# Patient Record
Sex: Female | Born: 1970 | Race: White | Hispanic: No | Marital: Married | Smoking: Never smoker
Health system: Southern US, Community
[De-identification: ages and names within clinical notes are randomized; demographics above are authoritative.]

---

## 1998-03-11 ENCOUNTER — Other Ambulatory Visit: Admission: RE | Admit: 1998-03-11 | Discharge: 1998-03-11 | Payer: Self-pay | Admitting: Obstetrics and Gynecology

## 1999-03-15 ENCOUNTER — Other Ambulatory Visit: Admission: RE | Admit: 1999-03-15 | Discharge: 1999-03-15 | Payer: Self-pay | Admitting: Obstetrics and Gynecology

## 2000-04-04 ENCOUNTER — Other Ambulatory Visit: Admission: RE | Admit: 2000-04-04 | Discharge: 2000-04-04 | Payer: Self-pay | Admitting: Obstetrics and Gynecology

## 2001-04-05 ENCOUNTER — Other Ambulatory Visit: Admission: RE | Admit: 2001-04-05 | Discharge: 2001-04-05 | Payer: Self-pay | Admitting: Obstetrics and Gynecology

## 2002-04-15 ENCOUNTER — Other Ambulatory Visit: Admission: RE | Admit: 2002-04-15 | Discharge: 2002-04-15 | Payer: Self-pay | Admitting: Obstetrics and Gynecology

## 2003-04-21 ENCOUNTER — Other Ambulatory Visit: Admission: RE | Admit: 2003-04-21 | Discharge: 2003-04-21 | Payer: Self-pay | Admitting: Obstetrics and Gynecology

## 2004-05-13 ENCOUNTER — Other Ambulatory Visit: Admission: RE | Admit: 2004-05-13 | Discharge: 2004-05-13 | Payer: Self-pay | Admitting: Obstetrics and Gynecology

## 2005-06-10 ENCOUNTER — Other Ambulatory Visit: Admission: RE | Admit: 2005-06-10 | Discharge: 2005-06-10 | Payer: Self-pay | Admitting: Obstetrics and Gynecology

## 2005-10-06 ENCOUNTER — Encounter (INDEPENDENT_AMBULATORY_CARE_PROVIDER_SITE_OTHER): Payer: Self-pay | Admitting: Specialist

## 2005-10-06 ENCOUNTER — Ambulatory Visit (HOSPITAL_COMMUNITY): Admission: RE | Admit: 2005-10-06 | Discharge: 2005-10-06 | Payer: Self-pay | Admitting: Obstetrics and Gynecology

## 2008-02-25 ENCOUNTER — Encounter (INDEPENDENT_AMBULATORY_CARE_PROVIDER_SITE_OTHER): Payer: Self-pay | Admitting: Obstetrics and Gynecology

## 2008-02-25 ENCOUNTER — Ambulatory Visit (HOSPITAL_COMMUNITY): Admission: RE | Admit: 2008-02-25 | Discharge: 2008-02-26 | Payer: Self-pay | Admitting: Obstetrics and Gynecology

## 2008-05-30 HISTORY — PX: PARTIAL HYSTERECTOMY: SHX80

## 2010-10-12 NOTE — Op Note (Signed)
Kathleen Mercer, Kathleen Mercer                   ACCOUNT NO.:  [id number]   MEDICAL RECORD NO.:  [id number]          PATIENT TYPE:  OIB   LOCATION:  9305                          FACILITY:  [facility]   PHYSICIAN:  Dineen Kid. Rana Snare, M.D.    DATE OF BIRTH:  25-Jul-1970   DATE OF PROCEDURE:  DATE OF DISCHARGE:                               OPERATIVE REPORT   PREOPERATIVE DIAGNOSES:  Menometrorrhagia, pelvic pain, and fibroids.   POSTOPERATIVE DIAGNOSES:  Menometrorrhagia, pelvic pain, and fibroids.   PROCEDURE:  Laparoscopic-assisted vaginal hysterectomy.   SURGEON:  Dineen Kid. Rana Snare, MD   ASSISTANT:  Duke Salvia. Marcelle Overlie, MD   ANESTHESIA:  General endotracheal.   INDICATIONS:  [name] is a 40 year old with worsening problems of  menometrorrhagia and pelvic pain.  She underwent endometrial ablation  which was successful for a short period of time, then she began having  irregular bleeding and pain not only with intercourse but also without.  She desires definitive surgical intervention and plans hysterectomy.   DESCRIPTION OF PROCEDURE:  The patient was placed in the dorsal  lithotomy position.  She was sterilely prepped and draped.  Bladder was  sterilely drained.  Graves speculum was placed.  A Hulka tenaculum was  placed in the cervix.  A 1-cm infraumbilical skin incision was then  made.  A Veress needle was inserted.  The abdomen was insufflated with  dullness to percussion.  A 5-mm trocar was inserted in the left of  midline 2 fingerbreadths above the pubic symphysis.  Normal-appearing  liver, appendix, and ovaries were noted, and a large fibroid uterus was  also noted.  The left round ligament was identified, ligated, and  dissected using Gyrus cutting forceps.  The left utero-ovarian ligament  was ligated and dissected as was the remaining portion of the broad  ligament along the left.  The right round ligament was identified,  ligated, and dissected as was the right utero-ovarian ligament and the  remaining portion of the broad ligament along the right.  Unable to  elevate the bladder flap due to a large fibroid noted in the anterior  surface, the abdomen was then desufflated, legs repositioned, and  weighted speculum was placed in the vagina.  Posterior colpotomy was  performed.  The cervix was circumscribed with Bovie cautery.  LigaSure  instruments were used to ligate across the uterosacral ligaments  bilaterally.  Cardinal ligaments and bladder pillars were dissected with  Mayo scissors.  The bladder was dissected off the anterior surface of  the cervix.  The anterior peritoneum was entered sharply, and a Deaver  retractor was placed underneath the bladder.  The inferior portion of  the broad ligament was then ligated with LigaSure and dissected with  Mayo scissors.  The uterus was removed.  The uterosacral ligaments were  grasped and ligated with figure-of-eight 0 Monocryl suture.  Posterior  peritoneum was closed in a purse-string fashion, and the vagina was  closed in a vertical fashion using figure-of-eight 0 Monocryl suture  with good approximation.  Good hemostasis noted.  Foley catheter was  placed with  return of clear yellow urine.  The legs were repositioned.  Abdomen reinsufflated.  Nezhat suction irrigator was used to irrigate  the abdomen and pelvis.  Re-examination of the pedicles revealed good  hemostasis.  Small peritoneal bleeders were grasped with bipolar cautery  and cauterized, and after copious amount of irrigation, adequate  hemostasis was assured.  The abdomen was then desufflated.  Trocar was  removed.  The infraumbilical skin incision was closed with 0 Vicryl  interrupted suture in the fascia, 3-0 Vicryl Rapide subcuticular suture,  5-mm site was closed with 3-0  Vicryl Rapide subcuticular suture and also Dermabond.  The incisions  were injected with 0.25% Marcaine, total of 10 mL used.  The patient was  stable on transfer to recovery.  Sponge and  instrument counts were  normal x3.  Estimated blood loss 200 mL.  The patient received 1 g of  cefotetan preoperatively.      Dineen Kid Rana Snare, M.D.  Electronically Signed     DCL/MEDQ  D:  02/25/2008  T:  02/25/2008  Job:  [id number]

## 2010-10-12 NOTE — Discharge Summary (Signed)
Kathleen Mercer, Kathleen Mercer                   ACCOUNT NO.:  [id number]   MEDICAL RECORD NO.:  [id number]          PATIENT TYPE:  OIB   LOCATION:  9305                          FACILITY:  [facility]   PHYSICIAN:  Dineen Kid. Rana Snare, M.D.    DATE OF BIRTH:  10-16-1970   DATE OF ADMISSION:  02/25/2008  DATE OF DISCHARGE:  02/26/2008                               DISCHARGE SUMMARY   HISTORY OF PRESENT ILLNESS:  Ms. Tindol is a 40 year old G3, P1 with  problems with menometrorrhagia, pelvic pain.  She has undergone  endometrial ablation which is successful only for a short period of  time.  She resumed to have irregular periods, pain all the time.  She  has fibroids which have shown degeneration.  She desired definitive  surgical intervention and presents for hysterectomy.   HOSPITAL COURSE:  The patient underwent a laparoscopic-assisted vaginal  hysterectomy and surgery was uncomplicated.  Estimated blood loss was  200 mL.  Her postoperative care was unremarkable.  She had good return  of bowel function, was ambulating.  On postoperative day #1, was  ambulating without difficulty, tolerating regular diet and able to pass  flatus.  Abdomen is soft, nontender, and nondistended with normoactive  bowel sounds, and the patient was discharged home.  Her postoperative  hemoglobin was 12.2.   DISPOSITION:  The patient will be discharged home.  She will follow up  in the office in 2-3 weeks.  Sent home with routine instruction sheet  for hysterectomy, told to return for increased pain, fever, or bleeding.  She is also sent a prescription for Tylox #20.      Dineen Kid Rana Snare, M.D.  Electronically Signed     DCL/MEDQ  D:  02/26/2008  T:  02/27/2008  Job:  [id number]

## 2010-10-12 NOTE — H&P (Signed)
Kathleen Mercer, Kathleen Mercer                   ACCOUNT NO.:  [id number]   MEDICAL RECORD NO.:  [id number]          PATIENT TYPE:  AMB   LOCATION:                                FACILITY:  [facility]   PHYSICIAN:  Dineen Kid. Rana Snare, M.D.    DATE OF BIRTH:  [birth year]   DATE OF ADMISSION:  02/25/2008  DATE OF DISCHARGE:                              HISTORY & PHYSICAL   HISTORY OF PRESENT ILLNESS:  Ms. Firebaugh is a 40 year old G3, P1, with  worsening problems with menometrorrhagia and pelvic pain.  She has  undergone an endometrial ablation which was successful for a short  period of time, but she began having irregular bleeding and pain, not  only with intercourse but also without.  She has had a tubal ligation in  the past as well.  She desires definitive surgical intervention and  presents for laparoscopic-assisted vaginal hysterectomy.   PAST MEDICAL HISTORY:  Significant for a history of anxiety, depression,  currently on Celexa.   CURRENT MEDICATIONS:  Include Celexa 20 mg daily.   ALLERGIES:  She has no known drug allergies.   PAST SURGERIES:  Tubal ligation, also endometrial ablation back in 2007.   PHYSICAL EXAMINATION:  VITAL SIGNS:  Blood pressure is 112/80.  HEART:  Regular rate and rhythm.  LUNGS:  Clear to auscultation bilaterally.  ABDOMEN:  Nondistended, nontender.  BACK:  Without CVA tenderness.  PELVIC EXAM:  The uterus is anteverted, mobile, nontender to deep  palpation.  Good bladder and rectal support.   Most recent ultrasound shows a uterus measuring 9.4 x 6.1 x 6.9 cm.  She  also has several intramural fibroids, the largest measuring 4 cm, which  are intramural in location, normal-appearing ovaries.   IMPRESSION AND:  Menometrorrhagia, pelvic pain and fibroids.  She  desires definitive intervention and requests hysterectomy.  She is  status post ablation and tubal ligation.   PLAN:  Laparoscopic-assisted vaginal hysterectomy.  The risks and  benefits of procedure were discussed  at length which include but are not  limited to risk of infection, bleeding, damage to the bowel, bladder,  ureters, ovaries, risks associated with anesthesia, risks associated  with blood transfusion.  She gives informed consent and wishes to  proceed.      Dineen Kid Rana Snare, M.D.  Electronically Signed    DCL/MEDQ  D:  02/24/2008  T:  02/24/2008  Job:  [id number]

## 2010-10-15 NOTE — Op Note (Signed)
Kathleen Mercer, Kathleen Mercer                   ACCOUNT NO.:  [id number]   MEDICAL RECORD NO.:  [id number]          PATIENT TYPE:  AMB   LOCATION:  SDC                           FACILITY:  [facility]   PHYSICIAN:  Dineen Kid. Rana Snare, M.D.    DATE OF BIRTH:  1971/03/31   DATE OF PROCEDURE:  10/06/2005  DATE OF DISCHARGE:                                 OPERATIVE REPORT   PREOPERATIVE DIAGNOSES:  1.  Multiparity with desired sterility.  2.  Menorrhagia and abnormal uterine bleeding.  3.  Fibroids.   POSTOPERATIVE DIAGNOSES:  1.  Multiparity with desired sterility.  2.  Menorrhagia and abnormal uterine bleeding.  3.  Fibroids.  4.  Pelvic adhesions and endometrial polyp.   PROCEDURE:  1.  Laparoscopic bilateral tubal ligation.  2.  Lysis of adhesions.  3.  Removal of intrauterine device.  4.  Hysteroscopy.  5.  Dilatation and curettage with polypectomy.  6.  NovaSure endometrial ablation.   SURGEON:  Dineen Kid. Rana Snare, M.D.   ANESTHESIA:  General endotracheal.   INDICATIONS:  Ms. Cina is a 40 year old G3, P2, A1, with menorrhagia,  desired sterility, who continued to have abnormal bleeding despite Mirena  IUD.  She desires sterility and presents for removal of IUD with tubal  ligation and also NovaSure endometrial ablation for her menorrhagia.  The  risks and benefits of the procedure were discussed at length; these include,  but not limited to, risks of infection, bleeding, damage to the uterus,  tubes, bowel and bladder and the possibility this may not alleviate the  bleeding and may actually worsen, risk of tubal failure quoted at 5 our of a  thousands, also risks associated with surgery, which could include blood  loss, risks of blood transfusion, general anesthesia or damage to internal  organs.  She give her informed consent and wishes to proceed.   FINDINGS AT TIME OF SURGERY:  Normal appendix and liver.  The uterus does  have several fibroids, some intramural and some subserosal, approximately  2-  3 cm in size.  The left ovary is adhered to the pelvic sidewall and also the  descending colon, otherwise normal-appearing abdomen.  Hysteroscopy revealed  a moderate-sized endometrial polyp, otherwise normal endometrial cavity  after D&C.   DESCRIPTION OF PROCEDURE:  After adequate analgesia, the patient was placed  in the dorsal lithotomy position where she was sterile prepped and draped  and the bladder sterilely drained.  A Graves speculum was placed.  Polyp  forceps were used to grasp and remove the IUD without any complications.  A  Hulka tenaculum was then placed on the cervix.  A 1-cm infraumbilical skin  incision was made.  A Veress needle was inserted.  The abdomen was  insufflated to dullness to percussion.  An 11-mm trocar was inserted and the  laparoscope was inserted; the above findings were noted.  The right and left  fallopian tubes were  identified by the fimbriated end.  Midportions of the  tubes were grasped with the bipolar cautery and cauterized over  approximately a 2- to 3-cm  section of the tube with good thermal burn noted  throughout the entirety of the tubes and also loss of resistance on the  ohmmeter.  Re-examination of both fallopian tubes revealed good thermal burn  and no obvious complications.  The left ovary, because it was adhered to the  colon and the left pelvic sidewall, was sharply dissected from the colon and  pelvic sidewall using EndoShears and bipolar cautery with good hemostasis  achieved, care taken to avoid the underlying bowel and ureter.  The abdomen  was then desufflated and trocar removed.  The infraumbilical skin incision  was closed with a 0 Vicryl interrupted sutured in the fascia and a 3-0  Vicryl Rapide subcuticular suture.  The incision was injected with 0.25%  Marcaine, 10 mL total used.  The legs were repositioned and Hulka tenaculum  removed.  A single-tooth tenaculum was placed on the anterior lip of the  cervix.  The uterus  was sounded to 9.5 cm.  Cervix was sounded to 4 cm; it  was easily dilated to a #25 Pratt dilator.  Hysteroscope was inserted,  revealing a moderate-sized endometrial polyp.  Polyp forceps were inserted  and polyp was removed.  Curettage was performed until a gritty surface was  felt throughout the endometrial cavity.  Re-examination of the hysteroscope  at this time revealed a normal-appearing endometrial cavity, a normal-  appearing ostia and cervix.  The NovaSure instrument was then inserted.  The  cavity width was noted to be 3.5 cm.  Cavity test was performed; after this  was complete, a NovaSure was carried out for 1 minute and 9 seconds at a  power of 106.  The NovaSure was then removed.  Examination with the  hysteroscope revealed good thermal burn throughout the entirety of the  cavity, no obvious complications.  Hysteroscope was then removed, tenaculum  removed from the cervix and noted to be hemostatic.  The patient was then  transferred to the recovery room in stable condition.  Sponge and instrument  count was normal x3.  Estimated blood loss was minimal.  Sorbitol deficit  was 0.  The patient received 30 mg of Toradol postoperatively and 1 g of  Rocephin preoperatively.   DISPOSITION:  The patient will be discharged home with followup in the  office in 2-3 weeks, sent home with a routine instruction sheet for D&C and  laparoscopy, and told to return for any increased pain, fever or bleeding.      Dineen Kid Rana Snare, M.D.  Electronically Signed     DCL/MEDQ  D:  10/06/2005  T:  10/07/2005  Job:  [id number]

## 2011-02-28 LAB — DIFFERENTIAL
Basophils Absolute: 0.1
Basophils Relative: 1
Eosinophils Relative: 1
Monocytes Absolute: 0.4

## 2011-02-28 LAB — CBC
HCT: 43.7
Hemoglobin: 14.5
MCHC: 33.2
Platelets: 313
Platelets: 368
RDW: 13.3
RDW: 13.4
WBC: 14 — ABNORMAL HIGH

## 2014-11-27 ENCOUNTER — Emergency Department (HOSPITAL_COMMUNITY): Payer: 59

## 2014-11-27 ENCOUNTER — Encounter (HOSPITAL_COMMUNITY): Payer: Self-pay | Admitting: Emergency Medicine

## 2014-11-27 ENCOUNTER — Emergency Department (HOSPITAL_COMMUNITY)
Admission: EM | Admit: 2014-11-27 | Discharge: 2014-11-27 | Disposition: A | Discharge status: Home / Self Care | Payer: 59 | Attending: Emergency Medicine | Admitting: Emergency Medicine

## 2014-11-27 DIAGNOSIS — S81812A Laceration without foreign body, left lower leg, initial encounter: Secondary | ICD-10-CM | POA: Insufficient documentation

## 2014-11-27 DIAGNOSIS — Y998 Other external cause status: Secondary | ICD-10-CM | POA: Diagnosis not present

## 2014-11-27 DIAGNOSIS — W01198A Fall on same level from slipping, tripping and stumbling with subsequent striking against other object, initial encounter: Secondary | ICD-10-CM | POA: Diagnosis not present

## 2014-11-27 DIAGNOSIS — Y9289 Other specified places as the place of occurrence of the external cause: Secondary | ICD-10-CM | POA: Diagnosis not present

## 2014-11-27 DIAGNOSIS — Y9389 Activity, other specified: Secondary | ICD-10-CM | POA: Diagnosis not present

## 2014-11-27 DIAGNOSIS — Z23 Encounter for immunization: Secondary | ICD-10-CM | POA: Insufficient documentation

## 2014-11-27 MED ORDER — IBUPROFEN 800 MG PO TABS: 800.0000 mg | ORAL_TABLET | Freq: Three times a day (TID) | ORAL | Status: AC

## 2014-11-27 MED ORDER — ONDANSETRON 8 MG PO TBDP
8.0000 mg | ORAL_TABLET | Freq: Once | ORAL | Status: AC
  Administered 2014-11-27: 8 mg via ORAL
  Filled 2014-11-27: qty 1

## 2014-11-27 MED ORDER — TETANUS-DIPHTH-ACELL PERTUSSIS 5-2.5-18.5 LF-MCG/0.5 IM SUSP
0.5000 mL | Freq: Once | INTRAMUSCULAR | Status: AC
  Administered 2014-11-27: 0.5 mL via INTRAMUSCULAR
  Filled 2014-11-27: qty 0.5

## 2014-11-27 MED ORDER — CEPHALEXIN 500 MG PO CAPS: 500.0000 mg | ORAL_CAPSULE | Freq: Four times a day (QID) | ORAL | Status: AC

## 2014-11-27 MED ORDER — LIDOCAINE-EPINEPHRINE (PF) 2 %-1:200000 IJ SOLN
20.0000 mL | Freq: Once | INTRAMUSCULAR | Status: AC
  Administered 2014-11-27: 20 mL
  Filled 2014-11-27: qty 20

## 2014-11-27 NOTE — ED Notes (Addendum)
Pt was outside playing with a water hose on concrete. Had a wine glass in her hand and fell on the concrete creating a laceration on the left lower leg. Unsure if she cut her leg with the concrete or the wine glass. Unsure of last tetanus. No other c/c. Placed betadine on wound.

## 2014-11-27 NOTE — ED Provider Notes (Signed)
CSN: [id number]     Arrival date & time 11/27/14  2127 History  This chart was scribed for Elpidio Anis, PA-C working with Zadie Rhine, MD by Evon Slack, ED Scribe. This patient was seen in room WTR9/WTR9 and the patient's care was started at 9:36 PM.      Chief Complaint  Patient presents with  . Extremity Laceration   The history is provided by the patient. No language interpreter was used.   HPI Comments: Kathleen Mercer is a 44 y.o. female who presents to the Emergency Department complaining of two left lower leg lacerations onset today PTA. Pt states that she dropped a wine glass on concrete and the glass shattered bouncing back up and cutting her left lower leg. She states that she didn't fall onto the glass. She states she cleaned the wound and applied a dressing. She states that the bleeding is not controlled. Pt states that she is unsure of her last tetanus. Pt denies numbness or tingling.   No past medical history on file. No past surgical history on file. No family history on file. History  Substance Use Topics  . Smoking status: Not on file  . Smokeless tobacco: Not on file  . Alcohol Use: Not on file   OB History    No data available      Review of Systems  Skin: Positive for wound.  Neurological: Negative for numbness.  All other systems reviewed and are negative.    Allergies  Review of patient's allergies indicates not on file.  Home Medications   Prior to Admission medications   Not on File   BP 125/77 mmHg  Pulse 62  Temp(Src) 97.8 F (36.6 C) (Oral)  Resp 14  SpO2 100%   Physical Exam  Constitutional: She is oriented to person, place, and time. She appears well-developed and well-nourished. No distress.  HENT:  Head: Normocephalic and atraumatic.  Eyes: Conjunctivae and EOM are normal.  Neck: Neck supple. No tracheal deviation present.  Cardiovascular: Normal rate.   Pulmonary/Chest: Effort normal. No respiratory distress.   Musculoskeletal: Normal range of motion.  Neurological: She is alert and oriented to person, place, and time.  Skin: Skin is warm and dry.  Lacerations x 2 to left anterior lower leg. There is a flap lac with borders measuring 6 cm and a second linear laceration proximal to the first that measures 3 cm. No foreign body observed or palpated.   Psychiatric: She has a normal mood and affect. Her behavior is normal.  Nursing note and vitals reviewed.   ED Course  Procedures (including critical care time) DIAGNOSTIC STUDIES: Oxygen Saturation is 100% on RA, normal by my interpretation.    COORDINATION OF CARE: 10:00 PM-Discussed treatment plan with pt at bedside and pt agreed to plan.     Labs Review Labs Reviewed - No data to display  Imaging Review Dg Tibia/fibula Left  11/27/2014   CLINICAL DATA:  Patient dropped a glass and it shattered and cut patient left lower leg, laceration distal tib fib anterior surface  EXAM: LEFT TIBIA AND FIBULA - 2 VIEW  COMPARISON:  None.  FINDINGS: There is no evidence of fracture or other focal bone lesions. Soft tissue laceration along the anterior mid -distal lower leg with a few radiopaque punctate foci which may be related to bandaging material versus small foreign bodies.  IMPRESSION: Soft tissue laceration along the anterior mid -distal lower leg with a few radiopaque punctate foci which may be related  to bandaging material versus small foreign bodies.   Electronically Signed   By: Elige KoHetal  Patel   On: 11/27/2014 23:02     EKG Interpretation None     LACERATION REPAIR Performed by: Elpidio AnisUPSTILL, Cielle Aguila A Authorized by: Elpidio AnisUPSTILL, Keedan Sample A Consent: Verbal consent obtained. Risks and benefits: risks, benefits and alternatives were discussed Consent given by: patient Patient identity confirmed: provided demographic data Prepped and Draped in normal sterile fashion Wound explored  Laceration Location: left lower extremity, anterior  Laceration Length:  6 cm  No Foreign Bodies seen or palpated  Anesthesia: local infiltration  Local anesthetic: lidocaine 2% w/o epinephrine  Anesthetic total: 3 ml  Irrigation method: syringe Amount of cleaning: standard  Skin closure: 3-0 prolene  Number of sutures: 15 Wound margin well approximated  Technique: simple interrupted.   Patient tolerance: Patient tolerated the procedure well with no immediate complications.   LACERATION REPAIR Performed by: Elpidio AnisUPSTILL, Shiquan Mathieu A Authorized by: Elpidio AnisUPSTILL, Jonathyn Carothers A Consent: Verbal consent obtained. Risks and benefits: risks, benefits and alternatives were discussed Consent given by: patient Patient identity confirmed: provided demographic data Prepped and Draped in normal sterile fashion Wound explored  Laceration Location: anterior left lower leg  Laceration Length: 3 cm  No Foreign Bodies seen or palpated  Anesthesia: local infiltration  Local anesthetic: lidocaine 2% w/o epinephrine  Anesthetic total: 2 ml  Irrigation method: syringe Amount of cleaning: standard  Skin closure: 3-0 prolene  Number of sutures: 6  Technique: simple interrupted  Patient tolerance: Patient tolerated the procedure well with no immediate complications.  MDM   Final diagnoses:  None   1. Multiple lacerations left lower extremity  Lacerations without deep tissue involvement, requiring repair as per above note.    I personally performed the services described in this documentation, which was scribed in my presence. The recorded information has been reviewed and is accurate.       Elpidio AnisShari Zaid Tomes, PA-C 11/28/14 2118  Zadie Rhineonald Wickline, MD 12/03/14 [phone]

## 2014-11-27 NOTE — Discharge Instructions (Signed)
Sutured Wound Care °Sutures are stitches that can be used to close wounds. Wound care helps prevent pain and infection.  °HOME CARE INSTRUCTIONS  °· Rest and elevate the injured area until all the pain and swelling are gone. °· Only take over-the-counter or prescription medicines for pain, discomfort, or fever as directed by your caregiver. °· After 48 hours, gently wash the area with mild soap and water once a day, or as directed. Rinse off the soap. Pat the area dry with a clean towel. Do not rub the wound. This may cause bleeding. °· Follow your caregiver's instructions for how often to change the bandage (dressing). Stop using a dressing after 2 days or after the wound stops draining. °· If the dressing sticks, moisten it with soapy water and gently remove it. °· Apply ointment on the wound as directed. °· Avoid stretching a sutured wound. °· Drink enough fluids to keep your urine clear or pale yellow. °· Follow up with your caregiver for suture removal as directed. °· Use sunscreen on your wound for the next 3 to 6 months so the scar will not darken. °SEEK IMMEDIATE MEDICAL CARE IF:  °· Your wound becomes red, swollen, hot, or tender. °· You have increasing pain in the wound. °· You have a red streak that extends from the wound. °· There is pus coming from the wound. °· You have a fever. °· You have shaking chills. °· There is a bad smell coming from the wound. °· You have persistent bleeding from the wound. °MAKE SURE YOU:  °· Understand these instructions. °· Will watch your condition. °· Will get help right away if you are not doing well or get worse. °Document Released: 06/23/2004 Document Revised: 08/08/2011 Document Reviewed: 09/19/2010 °[employer]® Patient Information ©2015 [employer]. This information is not intended to replace advice given to you by your health care provider. Make sure you discuss any questions you have with your health care provider. ° °

## 2015-11-18 IMAGING — CR DG TIBIA/FIBULA 2V*L*
4 series · 4 of 4 positions shown · non-contrast
Comparison: None.

CLINICAL DATA: Patient dropped a glass and it shattered and cut
patient left lower leg, laceration distal tib fib anterior surface

EXAM:
LEFT TIBIA AND FIBULA - 2 VIEW

[x tib-fib ap left]
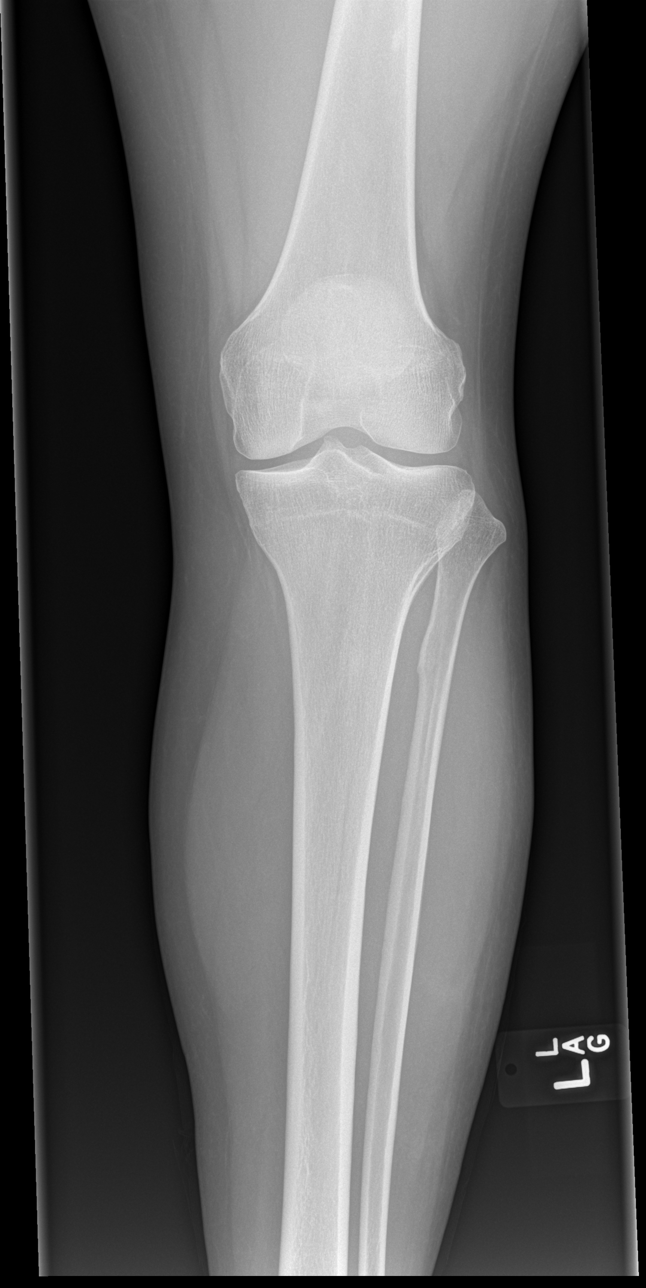

[x tib-fib lat left (1 of 3)]
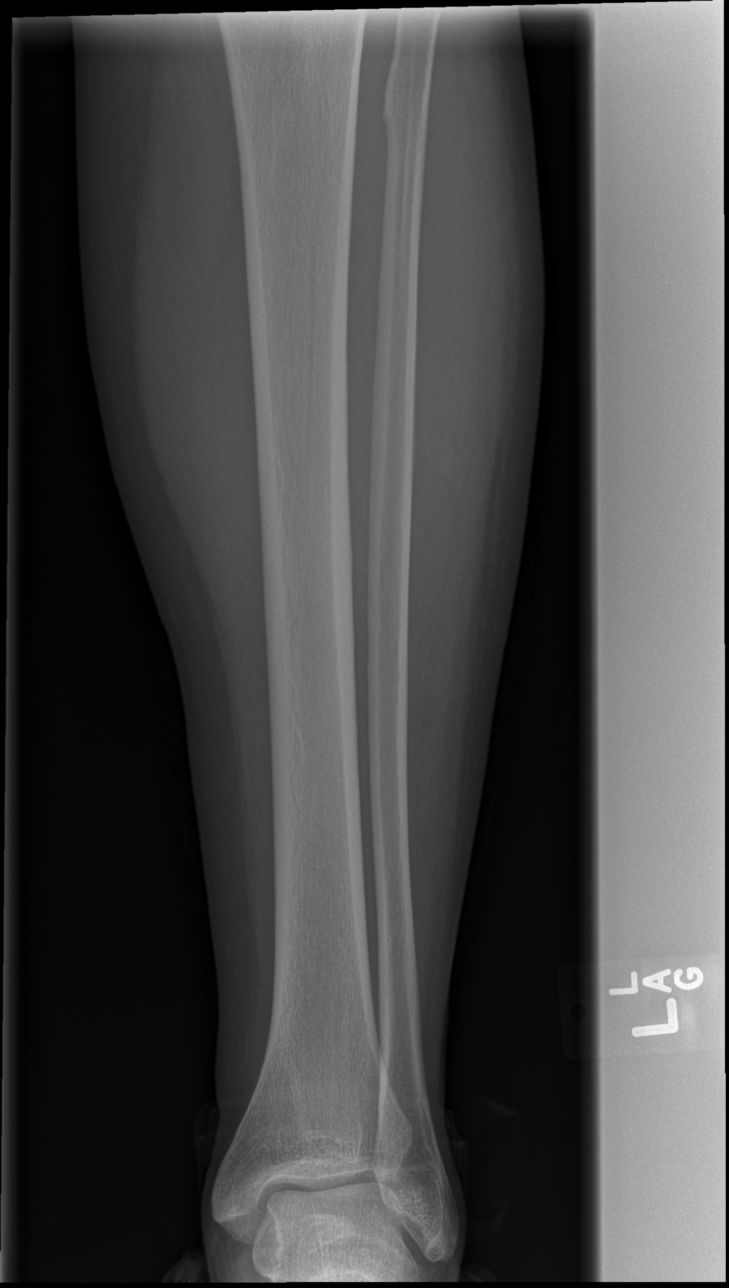

[x tib-fib lat left (2 of 3)]
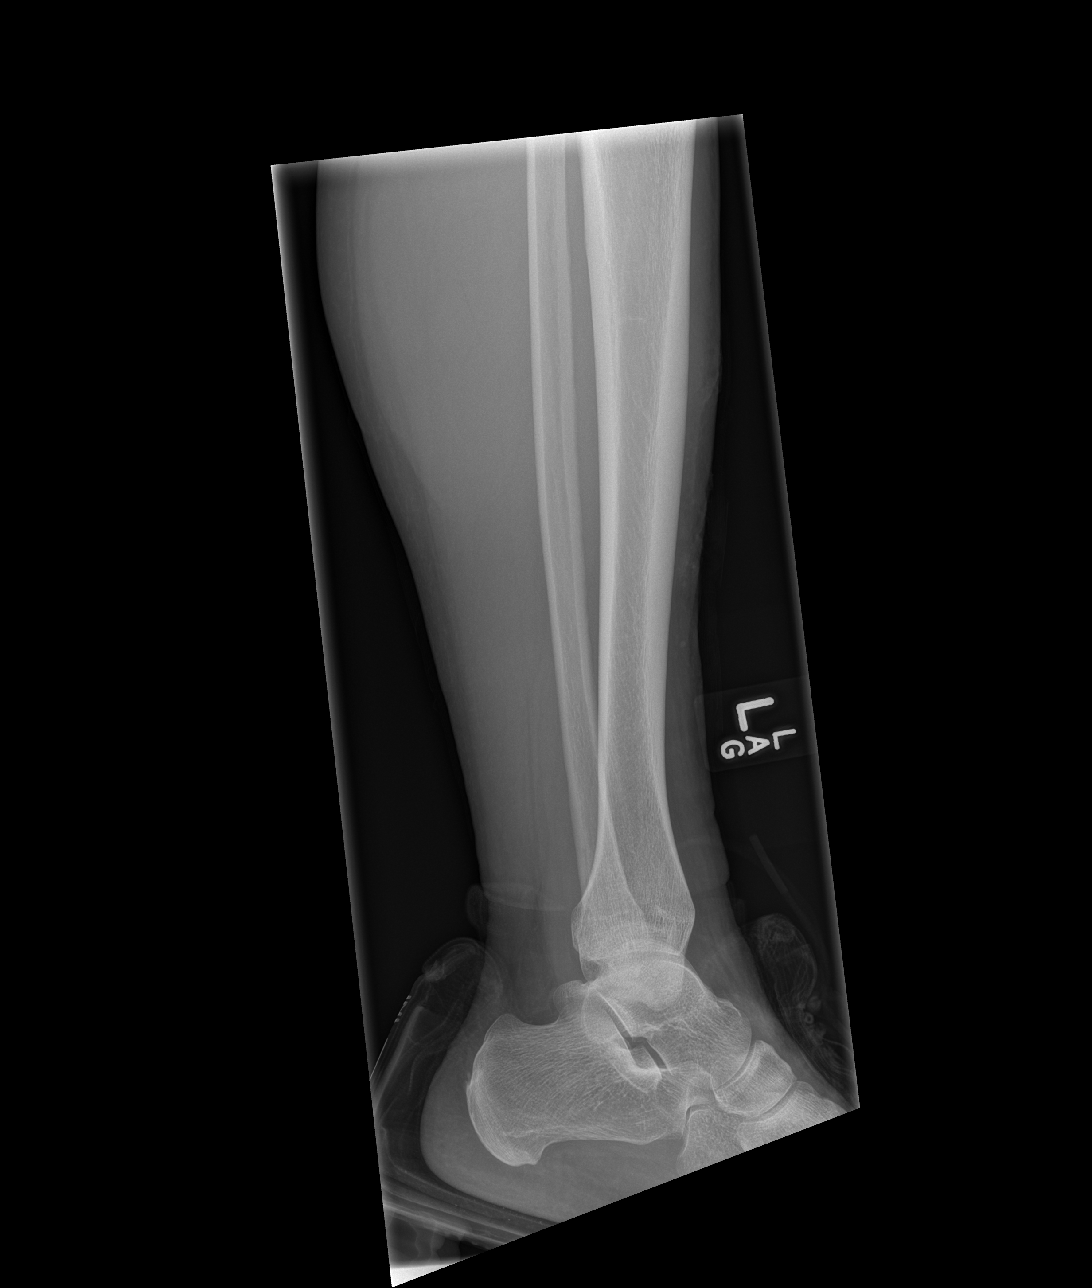

[x tib-fib lat left (3 of 3)]
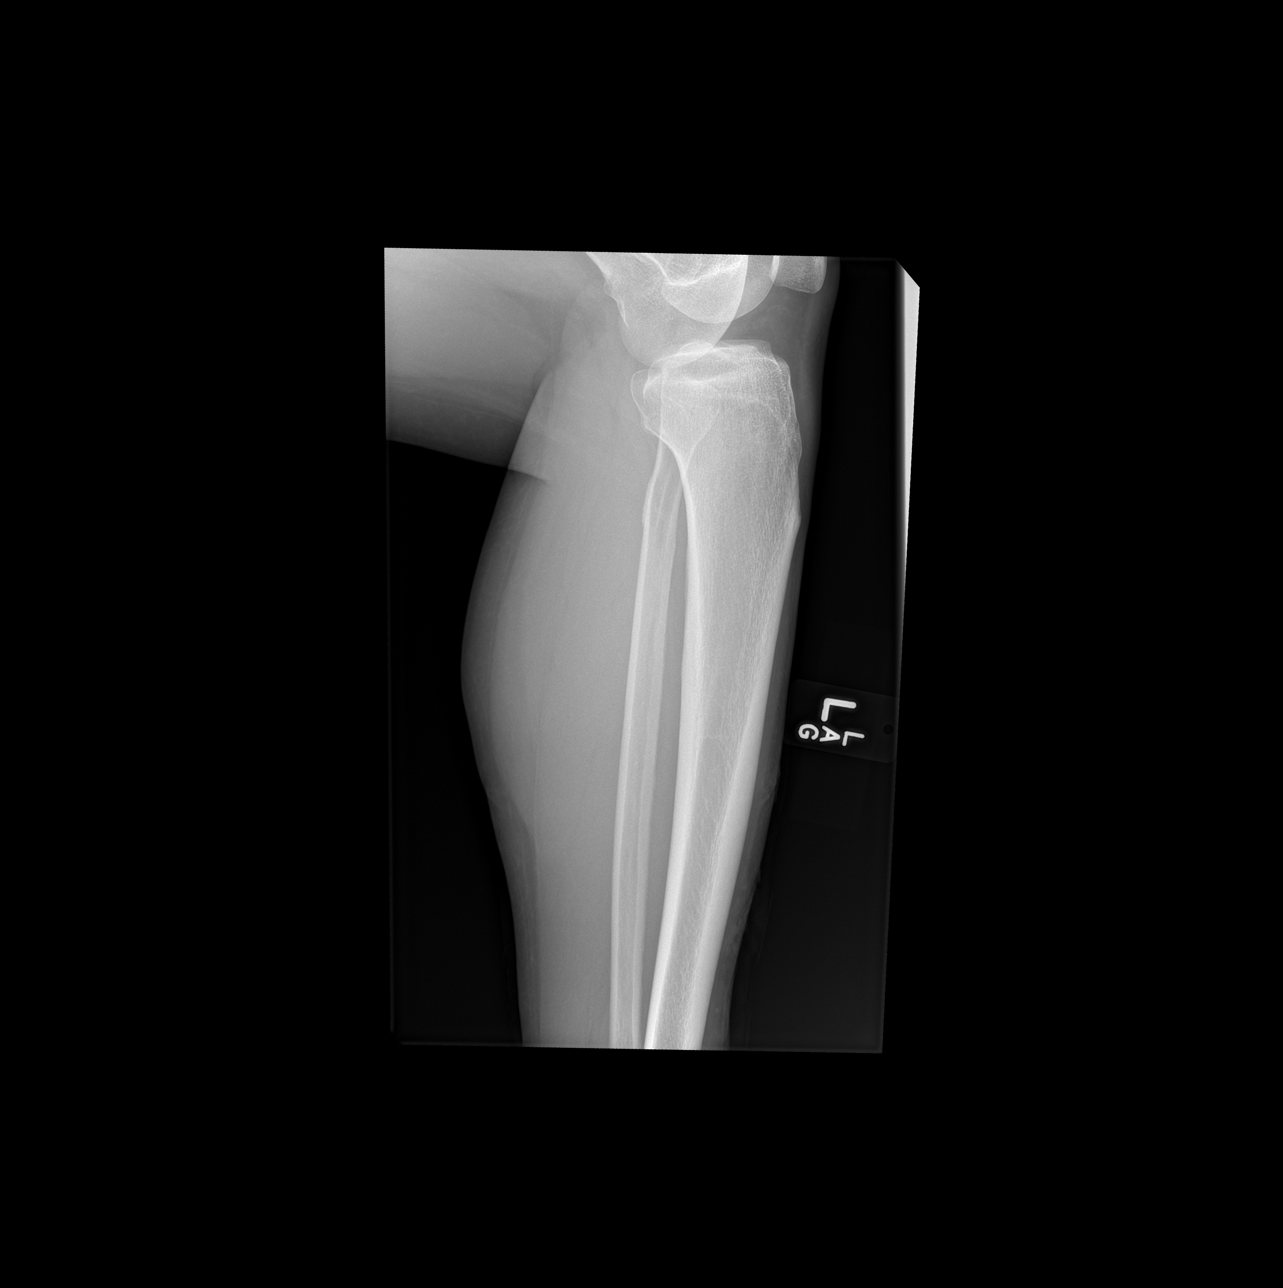

[4 of 4 positions shown; findings below may reference images not displayed]

FINDINGS: There is no evidence of fracture or other focal bone lesions. Soft
tissue laceration along the anterior mid -distal lower leg with a
few radiopaque punctate foci which may be related to bandaging
material versus small foreign bodies.
IMPRESSION: Soft tissue laceration along the anterior mid -distal lower leg with
a few radiopaque punctate foci which may be related to bandaging
material versus small foreign bodies.

## 2018-06-30 DEATH — deceased
